# Patient Record
Sex: Male | Born: 1990 | Race: Black or African American | Hispanic: No | Marital: Single | Smoking: Never smoker
Health system: Southern US, Community
[De-identification: ages and names within clinical notes are randomized; demographics above are authoritative.]

---

## 2015-05-04 ENCOUNTER — Emergency Department (HOSPITAL_COMMUNITY): Payer: Self-pay

## 2015-05-04 ENCOUNTER — Emergency Department (HOSPITAL_COMMUNITY)
Admission: EM | Admit: 2015-05-04 | Discharge: 2015-05-04 | Disposition: A | Discharge status: Home / Self Care | Payer: Self-pay | Attending: Emergency Medicine | Admitting: Emergency Medicine

## 2015-05-04 ENCOUNTER — Encounter (HOSPITAL_COMMUNITY): Payer: Self-pay | Admitting: Emergency Medicine

## 2015-05-04 DIAGNOSIS — R1033 Periumbilical pain: Secondary | ICD-10-CM | POA: Insufficient documentation

## 2015-05-04 DIAGNOSIS — R61 Generalized hyperhidrosis: Secondary | ICD-10-CM | POA: Insufficient documentation

## 2015-05-04 DIAGNOSIS — B349 Viral infection, unspecified: Secondary | ICD-10-CM | POA: Insufficient documentation

## 2015-05-04 DIAGNOSIS — R509 Fever, unspecified: Secondary | ICD-10-CM | POA: Insufficient documentation

## 2015-05-04 DIAGNOSIS — F419 Anxiety disorder, unspecified: Secondary | ICD-10-CM | POA: Insufficient documentation

## 2015-05-04 DIAGNOSIS — R109 Unspecified abdominal pain: Secondary | ICD-10-CM

## 2015-05-04 DIAGNOSIS — E869 Volume depletion, unspecified: Secondary | ICD-10-CM | POA: Insufficient documentation

## 2015-05-04 LAB — COMPREHENSIVE METABOLIC PANEL
ALBUMIN: 3.8 g/dL (ref 3.5–5.0)
ALK PHOS: 58 U/L (ref 38–126)
ALT: 30 U/L (ref 17–63)
AST: 47 U/L — AB (ref 15–41)
Anion gap: 10 (ref 5–15)
BUN: 13 mg/dL (ref 6–20)
CHLORIDE: 103 mmol/L (ref 101–111)
CO2: 23 mmol/L (ref 22–32)
Calcium: 9 mg/dL (ref 8.9–10.3)
Creatinine, Ser: 1.34 mg/dL — ABNORMAL HIGH (ref 0.61–1.24)
GFR calc Af Amer: >60 mL/min (ref >60)
Glucose, Bld: 94 mg/dL (ref 65–99)
POTASSIUM: 3.7 mmol/L (ref 3.5–5.1)
SODIUM: 136 mmol/L (ref 135–145)
Total Bilirubin: 0.6 mg/dL (ref 0.3–1.2)
Total Protein: 7.6 g/dL (ref 6.5–8.1)

## 2015-05-04 LAB — CBC WITH DIFFERENTIAL/PLATELET
Basophils Absolute: 0.2 10*3/uL — ABNORMAL HIGH (ref 0.0–0.1)
Basophils Relative: 3 % — ABNORMAL HIGH (ref 0–1)
EOS ABS: 0 10*3/uL (ref 0.0–0.7)
Eosinophils Relative: 0 % (ref 0–5)
HCT: 39.8 % (ref 39.0–52.0)
HEMOGLOBIN: 12.9 g/dL — AB (ref 13.0–17.0)
Lymphocytes Relative: 36 % (ref 12–46)
Lymphs Abs: 1.9 10*3/uL (ref 0.7–4.0)
MCH: 28.5 pg (ref 26.0–34.0)
MCHC: 32.4 g/dL (ref 30.0–36.0)
MCV: 87.9 fL (ref 78.0–100.0)
MONO ABS: 0.6 10*3/uL (ref 0.1–1.0)
Monocytes Relative: 11 % (ref 3–12)
NEUTROS PCT: 50 % (ref 43–77)
Neutro Abs: 2.5 10*3/uL (ref 1.7–7.7)
Platelets: 148 10*3/uL — ABNORMAL LOW (ref 150–400)
RBC: 4.53 MIL/uL (ref 4.22–5.81)
RDW: 12.2 % (ref 11.5–15.5)
WBC: 5.2 10*3/uL (ref 4.0–10.5)

## 2015-05-04 LAB — URINALYSIS, ROUTINE W REFLEX MICROSCOPIC
Bilirubin Urine: NEGATIVE
Glucose, UA: NEGATIVE mg/dL
HGB URINE DIPSTICK: NEGATIVE
Ketones, ur: 15 mg/dL — AB
NITRITE: NEGATIVE
PH: 6 (ref 5.0–8.0)
Protein, ur: NEGATIVE mg/dL
Specific Gravity, Urine: 1.024 (ref 1.005–1.030)
Urobilinogen, UA: 1 mg/dL (ref 0.0–1.0)

## 2015-05-04 LAB — LIPASE, BLOOD: Lipase: 21 U/L — ABNORMAL LOW (ref 22–51)

## 2015-05-04 LAB — URINE MICROSCOPIC-ADD ON

## 2015-05-04 MED ORDER — IOHEXOL 300 MG/ML  SOLN
100.0000 mL | Freq: Once | INTRAMUSCULAR | Status: AC | PRN
  Administered 2015-05-04: 100 mL via INTRAVENOUS

## 2015-05-04 MED ORDER — MORPHINE SULFATE 4 MG/ML IJ SOLN
4.0000 mg | Freq: Once | INTRAMUSCULAR | Status: AC
  Administered 2015-05-04: 4 mg via INTRAVENOUS
  Filled 2015-05-04: qty 1

## 2015-05-04 MED ORDER — IOHEXOL 300 MG/ML  SOLN
25.0000 mL | Freq: Once | INTRAMUSCULAR | Status: AC | PRN
  Administered 2015-05-04: 25 mL via ORAL

## 2015-05-04 MED ORDER — IBUPROFEN 400 MG PO TABS
600.0000 mg | ORAL_TABLET | Freq: Once | ORAL | Status: AC
  Administered 2015-05-04: 200 mg via ORAL
  Filled 2015-05-04 (×2): qty 1

## 2015-05-04 MED ORDER — SODIUM CHLORIDE 0.9 % IV BOLUS (SEPSIS)
2000.0000 mL | Freq: Once | INTRAVENOUS | Status: AC
  Administered 2015-05-04: 2000 mL via INTRAVENOUS

## 2015-05-04 MED ORDER — IBUPROFEN 600 MG PO TABS: 600.0000 mg | ORAL_TABLET | Freq: Three times a day (TID) | ORAL | Status: DC | PRN

## 2015-05-04 NOTE — ED Provider Notes (Signed)
CSN: [id number]     Arrival date & time 05/04/15  0040 History   This chart was scribed for Azalia Bilis, MD by Abel Presto, ED Scribe. This patient was seen in room D34C/D34C and the patient's care was started at 2:09 AM.    Chief Complaint  Patient presents with  . Headache    The history is provided by the patient. No language interpreter was used.   HPI Comments: Randall Frank is a 24 y.o. male with no significant PMHx who presents to the Emergency Department complaining of headache with onset 3 days ago. He notes associated periumbilical abdominal pain, chills and diaphoresis. He states he feels anxious. He has been eating and drinking well and states he has been staying hydrated. He has taken 1 200 mg ibuprofen for relief. Pt denies recent sick contacts and tick bites. Pt denies nausea, vomiting, diarrhea, difficulty urinating, dysuria, frequency, sore throat, chest pain and SOB.   History reviewed. No pertinent past medical history. History reviewed. No pertinent past surgical history. No family history on file. History  Substance Use Topics  . Smoking status: Never Smoker   . Smokeless tobacco: Not on file  . Alcohol Use: Yes    Review of Systems  Neurological: Positive for headaches.  A complete 10 system review of systems was obtained and all systems are negative except as noted in the HPI and PMH.      Allergies  Review of patient's allergies indicates no known allergies.  Home Medications   Prior to Admission medications   Not on File   BP 116/56 mmHg  Pulse 62  Temp(Src) 98.2 F (36.8 C) (Oral)  Resp 18  Wt 190 lb 4.8 oz (86.32 kg)  SpO2 99% Physical Exam  Constitutional: He is oriented to person, place, and time. He appears well-developed and well-nourished.  Tactile fever  HENT:  Head: Normocephalic and atraumatic.  Eyes: EOM are normal.  Neck: Normal range of motion.  Cardiovascular: Normal rate, regular rhythm, normal heart sounds and intact  distal pulses.   Pulmonary/Chest: Effort normal and breath sounds normal. No respiratory distress.  Abdominal: Soft. He exhibits no distension. There is tenderness (mild) in the periumbilical area.  Musculoskeletal: Normal range of motion.  Neurological: He is alert and oriented to person, place, and time.  Skin: Skin is warm and dry.  Psychiatric: He has a normal mood and affect. Judgment normal.  Nursing note and vitals reviewed.   ED Course  Procedures (including critical care time) DIAGNOSTIC STUDIES: Oxygen Saturation is 99% on room air, normal by my interpretation.    COORDINATION OF CARE: 2:15 AM Discussed treatment plan with patient at beside, the patient agrees with the plan and has no further questions at this time.   Labs Review Labs Reviewed  CBC WITH DIFFERENTIAL/PLATELET - Abnormal; Notable for the following:    Hemoglobin 12.9 (*)    Platelets 148 (*)    Basophils Relative 3 (*)    Basophils Absolute 0.2 (*)    All other components within normal limits  COMPREHENSIVE METABOLIC PANEL - Abnormal; Notable for the following:    Creatinine, Ser 1.34 (*)    AST 47 (*)    All other components within normal limits  LIPASE, BLOOD - Abnormal; Notable for the following:    Lipase 21 (*)    All other components within normal limits  URINALYSIS, ROUTINE W REFLEX MICROSCOPIC (NOT AT [facility]) - Abnormal; Notable for the following:    APPearance CLOUDY (*)  Ketones, ur 15 (*)    Leukocytes, UA SMALL (*)    All other components within normal limits  URINE MICROSCOPIC-ADD ON    Imaging Review Ct Abdomen Pelvis W Contrast  05/04/2015   CLINICAL DATA:  Periumbilical pain for 3 days.  Fever.  EXAM: CT ABDOMEN AND PELVIS WITH CONTRAST  TECHNIQUE: Multidetector CT imaging of the abdomen and pelvis was performed using the standard protocol following bolus administration of intravenous contrast.  CONTRAST:  OMNIPAQUE IOHEXOL 300 MG/ML  SOLN  COMPARISON:  None.  FINDINGS: Lower  chest:  The included lung bases are clear.  Liver: Normal in size.  No focal lesion.  Hepatobiliary: Gallbladder is decompressed.  No biliary dilatation.  Pancreas: Normal.  Spleen: Normal.  Adrenal glands: No nodule.  Kidneys: Symmetric renal enhancement. No hydronephrosis. No focal abnormality or perinephric stranding.  Stomach/Bowel: Stomach physiologically distended. There are no dilated or thickened small bowel loops. Small volume of stool throughout the colon without colonic wall thickening. The appendix is normal.  Vascular/Lymphatic: No retroperitoneal adenopathy. Abdominal aorta is normal in caliber. Is history small left renal artery, incidentally noted.  Reproductive: Prostate gland is normal in size.  Bladder: Minimally distended, no wall thickening.  Other: No free air, free fluid, or intra-abdominal fluid collection.  Musculoskeletal: There are no acute or suspicious osseous abnormalities. Incidental note of 4 non-rib-bearing lumbar vertebra, a normal variant.  IMPRESSION: No acute abnormality in the abdomen/pelvis.   Electronically Signed   By: Rubye Oaks M.D.   On: 05/04/2015 05:51  I personally reviewed the imaging tests through PACS system I reviewed available ER/hospitalization records through the EMR    EKG Interpretation None      MDM   Final diagnoses:  Fever, unspecified fever cause  Abdominal pain, unspecified abdominal location   Volume depletion and viral illness. Abdominal tenderness, however CT scan negative  I personally performed the services described in this documentation, which was scribed in my presence. The recorded information has been reviewed and is accurate.        Azalia Bilis, MD 05/05/15 [phone]

## 2015-05-04 NOTE — ED Notes (Signed)
To ct

## 2015-05-04 NOTE — ED Notes (Signed)
Pt. reports intermittent headache with chills onset 3 days ago unrelieved by OTC Ibuprofen , denies head injury / no fever .

## 2015-05-04 NOTE — ED Notes (Signed)
The pt had  of ibu 3 hours ago

## 2015-05-04 NOTE — Discharge Instructions (Signed)

## 2015-05-04 NOTE — ED Notes (Signed)
The pts headache is better ?

## 2015-05-04 NOTE — ED Notes (Signed)
Patient transported to CT 

## 2015-05-04 NOTE — ED Notes (Signed)
The pt feels strange in his head for 3 days  He has had chills and sweating also.  No n v or diarrhea. The pt does not usually have headaches.  Family at the bedside

## 2017-02-10 ENCOUNTER — Ambulatory Visit (INDEPENDENT_AMBULATORY_CARE_PROVIDER_SITE_OTHER): Payer: BLUE CROSS/BLUE SHIELD | Admitting: Family Medicine

## 2017-02-10 ENCOUNTER — Encounter: Payer: Self-pay | Admitting: Family Medicine

## 2017-02-10 VITALS — BP 127/72 | HR 55 | Temp 98.3°F | Resp 18 | Ht 68.39 in | Wt 179.6 lb

## 2017-02-10 DIAGNOSIS — L02419 Cutaneous abscess of limb, unspecified: Secondary | ICD-10-CM

## 2017-02-10 MED ORDER — DOXYCYCLINE HYCLATE 100 MG PO TABS: 100.0000 mg | ORAL_TABLET | Freq: Two times a day (BID) | ORAL | 0 refills | Status: DC

## 2017-02-10 NOTE — Progress Notes (Signed)
PROCEDURE NOTE: I&D of Abscess Verbal consent obtained. Alcohol prep pad used. Local anesthesia with 1.5cc of 1% lidocaine with epinephrine. Incision of 1cm was made using a 11 blade, discharge of copious amounts of pus and serosanguinous fluid. Wound cavity was explored with curved hemostats and packed with 1/4" plain packing. Cleansed and dressed.  Wallis BambergMario Bernadene Garside, PA-C [facility] at [facility] [phone] 02/10/2017  5:57 PM

## 2017-02-10 NOTE — Progress Notes (Signed)
   Randall Frank is a 26 y.o. male who presents to [facility] at [facility] today for abscess in axilla.  1.  Reports abscess in left axilla for 3 days. Area is tender. Denies drainage. Denies fever. No other abscess noted in groin or right axilla. Reports history of prior, with most recent 4 months ago. Had bactrim at home from previous abscess, which he started 3 days ago.   No real improvement with this.    ROS as above. No fevers/chills/nausea/vomiting.  No other skin lesions or rashes.   PMH reviewed. Patient is a nonsmoker.   History reviewed. No pertinent past medical history. History reviewed. No pertinent surgical history.  Medications reviewed. Current Outpatient Prescriptions  Medication Sig Dispense Refill  . ibuprofen (ADVIL,MOTRIN) 600 MG tablet Take 1 tablet (600 mg total) by mouth every 8 (eight) hours as needed. 15 tablet 0   No current facility-administered medications for this visit.    Physical Exam:  BP 127/72 (BP Location: Right Arm, Patient Position: Sitting, Cuff Size: Small)   Pulse (!) 55   Temp 98.3 F (36.8 C) (Oral)   Resp 18   Ht 5' 8.39" (1.737 m)   Wt 179 lb 9.6 oz (81.5 kg)   SpO2 96%   BMI 27.00 kg/m  Gen:  Alert, cooperative patient who appears stated age in no acute distress.  Vital signs reviewed. HEENT: EOMI,  MMM Pulm:  Clear to auscultation bilaterally  Cardiac:  Regular rate and rhythm  Abd:  Soft/nondistended/nontender.  Skin: Area of fluctuance with slight erythema noted in left axilla  Assessment and Plan:  1.  Abscess, axillary:  - I&D here in clinic - treating with abx to fully cover - no culture.  Doxycycline will cover for MRSA - return for wound recheck/packing check in 3 days

## 2017-02-10 NOTE — Patient Instructions (Addendum)
It was good to see you today.   Take the doxycycline twice daily for the next 7 days.    If you start having any redness, fevers, or worsening symptoms come back and see us immediately.    Otherwise come back in 3 days so we can check the packing.   Incision and Drainage, Care After Refer to this sheet in the next few weeks. These instructions provide you with information about caring for yourself after your procedure. Your health care provider may also give you more specific instructions. Your treatment has been planned according to current medical practices, but problems sometimes occur. Call your health care provider if you have any problems or questions after your procedure. What can I expect after the procedure? After the procedure, it is common to have:  Pain or discomfort around your incision site.  Drainage from your incision. Follow these instructions at home:  Take over-the-counter and prescription medicines only as told by your health care provider.  If you were prescribed an antibiotic medicine, take it as told by your health care provider.Do not stop taking the antibiotic even if you start to feel better.  Followinstructions from your health care provider about:  How to take care of your incision.  When and how you should change your packing and bandage (dressing). Wash your hands with soap and water before you change your dressing. If soap and water are not available, use hand sanitizer.  When you should remove your dressing.  Do not take baths, swim, or use a hot tub until your health care provider approves.  Keep all follow-up visits as told by your health care provider. This is important.  Check your incision area every day for signs of infection. Check for:  More redness, swelling, or pain.  More fluid or blood.  Warmth.  Pus or a bad smell. Contact a health care provider if:  Your cyst or abscess returns.  You have a fever.  You have more redness,  swelling, or pain around your incision.  You have more fluid or blood coming from your incision.  Your incision feels warm to the touch.  You have pus or a bad smell coming from your incision. Get help right away if:  You have severe pain or bleeding.  You cannot eat or drink without vomiting.  You have decreased urine output.  You become short of breath.  You have chest pain.  You cough up blood.  The area where the incision and drainage occurred becomes numb or it tingles. This information is not intended to replace advice given to you by your health care provider. Make sure you discuss any questions you have with your health care provider. Document Released: 12/14/2011 Document Revised: 02/21/2016 Document Reviewed: 07/12/2015 Elsevier Interactive Patient Education  2017 [employer]     IF you received an x-ray today, you will receive an invoice from [facility]. Please contact [facility] at [phone] with questions or concerns regarding your invoice.   IF you received labwork today, you will receive an invoice from [facility]. Please contact [facility] at [phone] with questions or concerns regarding your invoice.   Our billing staff will not be able to assist you with questions regarding bills from these companies.  You will be contacted with the lab results as soon as they are available. The fastest way to get your results is to activate your My Chart account. Instructions are located on the last page of this paperwork. If you have not  heard from [place] regarding the results in 2 weeks, please contact this office.

## 2017-02-13 ENCOUNTER — Encounter: Payer: Self-pay | Admitting: Physician Assistant

## 2017-02-13 ENCOUNTER — Ambulatory Visit (INDEPENDENT_AMBULATORY_CARE_PROVIDER_SITE_OTHER): Payer: BLUE CROSS/BLUE SHIELD | Admitting: Physician Assistant

## 2017-02-13 VITALS — BP 135/74 | HR 48 | Temp 98.1°F | Resp 18 | Ht 68.39 in | Wt 178.0 lb

## 2017-02-13 DIAGNOSIS — Z5189 Encounter for other specified aftercare: Secondary | ICD-10-CM

## 2017-02-13 DIAGNOSIS — L02419 Cutaneous abscess of limb, unspecified: Secondary | ICD-10-CM

## 2017-02-13 NOTE — Patient Instructions (Signed)
     IF you received an x-ray today, you will receive an invoice from [facility]. Please contact [facility] at [phone] with questions or concerns regarding your invoice.   IF you received labwork today, you will receive an invoice from [facility]. Please contact [facility] at [phone] with questions or concerns regarding your invoice.   Our billing staff will not be able to assist you with questions regarding bills from these companies.  You will be contacted with the lab results as soon as they are available. The fastest way to get your results is to activate your My Chart account. Instructions are located on the last page of this paperwork. If you have not heard from us regarding the results in 2 weeks, please contact this office.     

## 2017-02-13 NOTE — Progress Notes (Signed)
Primary Care at [facility]   MRN: [id number] DOB: 11/09/1990  Subjective:   Randall RoadsBlake Frank is a 26 y.o. male presenting for follow up on wound.  He was here 5/9 for abscess on axilla. He received an I&D and wound was packed with 1/4" packing.  No culture was taken.  Today he reports improvement of pain. He is taking Doxycycline.    Harrison MonsBlake has a current medication list which includes the following prescription(s): doxycycline and ibuprofen. Also has No Known Allergies.  Harrison MonsBlake  has no past medical history on file. Also  has no past surgical history on file.   Objective:   Vitals: BP 135/74   Pulse (!) 48   Temp 98.1 F (36.7 C) (Oral)   Resp 18   Ht 5' 8.39" (1.737 m)   Wt 178 lb (80.7 kg)   SpO2 99%   BMI 26.76 kg/m   Physical Exam  Skin:  Packing in place. Removed without difficulty. Mild induration. No erythema. Mildly TTP.  No serous sanguinous or purulent drainage with deep palpation. Irrigation with 5 cc lidocaine. Wound explored and found to be about 2 cm. Wound lightly repacked with 1/4in packing and wound dressed.     No results found for this or any previous visit (from the past 24 hour(s)).  Assessment and Plan :  1. Encounter for wound care 2. Abscess, axilla - Wound irrigated and lightly repacked with 1/4 inch packing. No culture taken on original visit. He is taking Doxycycline. Wound care discussed. RTC in 48 hours for wound care.   Marco CollieWhitney Cybill Uriegas, PA-C  [facility] at [facility] 02/13/2017 10:32 AM

## 2017-02-17 ENCOUNTER — Ambulatory Visit: Payer: BLUE CROSS/BLUE SHIELD | Admitting: Physician Assistant

## 2019-12-07 ENCOUNTER — Emergency Department (HOSPITAL_COMMUNITY)
Admission: EM | Admit: 2019-12-07 | Discharge: 2019-12-07 | Disposition: A | Discharge status: Home / Self Care | Payer: Self-pay | Attending: Emergency Medicine | Admitting: Emergency Medicine

## 2019-12-07 ENCOUNTER — Encounter (HOSPITAL_COMMUNITY): Payer: Self-pay | Admitting: Emergency Medicine

## 2019-12-07 ENCOUNTER — Emergency Department (HOSPITAL_COMMUNITY): Payer: Self-pay

## 2019-12-07 ENCOUNTER — Other Ambulatory Visit: Payer: Self-pay

## 2019-12-07 DIAGNOSIS — Y929 Unspecified place or not applicable: Secondary | ICD-10-CM | POA: Insufficient documentation

## 2019-12-07 DIAGNOSIS — Z23 Encounter for immunization: Secondary | ICD-10-CM | POA: Insufficient documentation

## 2019-12-07 DIAGNOSIS — Y939 Activity, unspecified: Secondary | ICD-10-CM | POA: Insufficient documentation

## 2019-12-07 DIAGNOSIS — S21212A Laceration without foreign body of left back wall of thorax without penetration into thoracic cavity, initial encounter: Secondary | ICD-10-CM

## 2019-12-07 DIAGNOSIS — S31010A Laceration without foreign body of lower back and pelvis without penetration into retroperitoneum, initial encounter: Secondary | ICD-10-CM | POA: Insufficient documentation

## 2019-12-07 DIAGNOSIS — Y999 Unspecified external cause status: Secondary | ICD-10-CM | POA: Insufficient documentation

## 2019-12-07 DIAGNOSIS — S022XXB Fracture of nasal bones, initial encounter for open fracture: Secondary | ICD-10-CM

## 2019-12-07 DIAGNOSIS — S0101XA Laceration without foreign body of scalp, initial encounter: Secondary | ICD-10-CM | POA: Insufficient documentation

## 2019-12-07 DIAGNOSIS — S022XXA Fracture of nasal bones, initial encounter for closed fracture: Secondary | ICD-10-CM | POA: Insufficient documentation

## 2019-12-07 MED ORDER — LIDOCAINE-EPINEPHRINE 1 %-1:100000 IJ SOLN
30.0000 mL | Freq: Once | INTRAMUSCULAR | Status: AC
  Administered 2019-12-07: 04:00:00 30 mL
  Filled 2019-12-07: qty 30

## 2019-12-07 MED ORDER — HYDROCODONE-ACETAMINOPHEN 5-325 MG PO TABS
1.0000 | ORAL_TABLET | Freq: Once | ORAL | Status: AC
Start: 2019-12-07 — End: 2019-12-07
  Administered 2019-12-07: 02:00:00 1 via ORAL
  Filled 2019-12-07: qty 1

## 2019-12-07 MED ORDER — CEPHALEXIN 250 MG PO CAPS: 250.0000 mg | ORAL_CAPSULE | Freq: Four times a day (QID) | ORAL | 0 refills | Status: DC

## 2019-12-07 MED ORDER — LIDOCAINE-EPINEPHRINE (PF) 2 %-1:200000 IJ SOLN
20.0000 mL | Freq: Once | INTRAMUSCULAR | Status: DC
  Filled 2019-12-07: qty 20

## 2019-12-07 MED ORDER — LIDOCAINE-EPINEPHRINE (PF) 2 %-1:200000 IJ SOLN: 30.0000 mL | Freq: Once | INTRAMUSCULAR | Status: DC

## 2019-12-07 MED ORDER — TETANUS-DIPHTH-ACELL PERTUSSIS 5-2.5-18.5 LF-MCG/0.5 IM SUSP
0.5000 mL | Freq: Once | INTRAMUSCULAR | Status: AC
  Administered 2019-12-07: 0.5 mL via INTRAMUSCULAR
  Filled 2019-12-07: qty 0.5

## 2019-12-07 MED ORDER — METHOCARBAMOL 500 MG PO TABS: 500.0000 mg | ORAL_TABLET | Freq: Two times a day (BID) | ORAL | 0 refills | Status: DC | PRN

## 2019-12-07 MED ORDER — CEFAZOLIN SODIUM-DEXTROSE 1-4 GM/50ML-% IV SOLN
1.0000 g | Freq: Once | INTRAVENOUS | Status: AC
  Administered 2019-12-07: 1 g via INTRAVENOUS
  Filled 2019-12-07: qty 50

## 2019-12-07 NOTE — Discharge Instructions (Signed)
Take ibuprofen 3 times a day with meals.  Do not take other anti-inflammatories at the same time (Advil, Motrin, ibuprofen, Aleve). You may supplement with Tylenol if you need further pain control. Use Robaxin as needed for muscle stiffness or soreness. Have caution, as this may make you tired or groggy. Do not drive or operate heavy machinery while taking this medication.  Use muscle creams (bengay, icy hot, salonpas) as needed for pain.   Wash your cuts every day with soap and water.  After, keep dry and clean.  Do not apply any ointments. Staples should be removed 10 days.  This can be done at urgent care or back in the emergency department. Take antibiotics as prescribed.  Take entire course, even if your symptoms improve. Do not bend at your back, especially to lift heavy things.  Call the ear nose and throat doctor listed below in the morning to set up a follow-up appointment for your nose fracture.  Return to the emergency room if you develop high fevers, persistent vomiting, pus draining from any of the wounds, vision changes, or any new, worsening, concerning symptoms.
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
## 2019-12-07 NOTE — ED Notes (Signed)
Pt ambulated to BR independent successfully

## 2019-12-07 NOTE — ED Notes (Signed)
PA at bedside.
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
## 2019-12-07 NOTE — ED Notes (Signed)
Pt transported to xray, will then go to CT afterward
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
## 2023-06-16 ENCOUNTER — Emergency Department (HOSPITAL_BASED_OUTPATIENT_CLINIC_OR_DEPARTMENT_OTHER)
Admission: EM | Admit: 2023-06-16 | Discharge: 2023-06-16 | Disposition: A | Discharge status: Home / Self Care | Payer: Self-pay | Attending: Emergency Medicine | Admitting: Emergency Medicine

## 2023-06-16 ENCOUNTER — Other Ambulatory Visit: Payer: Self-pay

## 2023-06-16 ENCOUNTER — Encounter (HOSPITAL_BASED_OUTPATIENT_CLINIC_OR_DEPARTMENT_OTHER): Payer: Self-pay

## 2023-06-16 DIAGNOSIS — K611 Rectal abscess: Secondary | ICD-10-CM | POA: Insufficient documentation

## 2023-06-16 MED ORDER — DOXYCYCLINE HYCLATE 100 MG PO CAPS
100.0000 mg | ORAL_CAPSULE | Freq: Two times a day (BID) | ORAL | 0 refills | Status: AC
Start: 2023-06-16 — End: ?

## 2023-06-16 MED ORDER — LIDOCAINE-EPINEPHRINE (PF) 2 %-1:200000 IJ SOLN
10.0000 mL | Freq: Once | INTRAMUSCULAR | Status: AC
  Administered 2023-06-16: 10 mL
  Filled 2023-06-16: qty 20

## 2023-06-16 MED ORDER — OXYCODONE-ACETAMINOPHEN 5-325 MG PO TABS
1.0000 | ORAL_TABLET | Freq: Once | ORAL | Status: AC
  Administered 2023-06-16: 1 via ORAL
  Filled 2023-06-16: qty 1

## 2023-06-16 MED ORDER — OXYCODONE-ACETAMINOPHEN 5-325 MG PO TABS
1.0000 | ORAL_TABLET | Freq: Four times a day (QID) | ORAL | 0 refills | Status: AC | PRN
Start: 2023-06-16 — End: ?

## 2023-06-16 NOTE — Discharge Instructions (Signed)
Please read and follow all provided instructions.  Your diagnoses today include:  1. Perirectal abscess     Tests performed today include: Vital signs. See below for your results today.   Medications prescribed:  Percocet (oxycodone/acetaminophen) - narcotic pain medication  DO NOT drive or perform any activities that require you to be awake and alert because this medicine can make you drowsy. BE VERY CAREFUL not to take multiple medicines containing Tylenol (also called acetaminophen). Doing so can lead to an overdose which can damage your liver and cause liver failure and possibly death.  Doxycycline - antibiotic  You have been prescribed an antibiotic medicine: take the entire course of medicine even if you are feeling better. Stopping early can cause the antibiotic not to work.  Take any prescribed medications only as directed.   Home care instructions:  Follow any educational materials contained in this packet  Follow-up instructions: Return to the Emergency Department in 48 hours for a recheck if your symptoms are not significantly improved.  Please follow-up with your primary care provider in the next 1 week for further evaluation of your symptoms.   Return instructions:  Return to the Emergency Department if you have: Fever Worsening symptoms Worsening pain Worsening swelling Redness of the skin that moves away from the affected area, especially if it streaks away from the affected area  Any other emergent concerns   Your vital signs today were: BP (!) 146/82   Pulse 62   Temp 98.2 F (36.8 C) (Oral)   Resp 17   Ht 5\' 10"  (1.778 m)   Wt 90.7 kg   SpO2 99%   BMI 28.70 kg/m  If your blood pressure (BP) was elevated above 135/85 this visit, please have this repeated by your doctor within one month. --------------

## 2023-06-16 NOTE — ED Notes (Signed)
Abscess noted on Pt. L butt cheek.

## 2023-06-16 NOTE — ED Triage Notes (Signed)
Pt presents to ED from home C/O pilonidal cyst since X 5 days, hx of same.

## 2023-06-16 NOTE — ED Provider Notes (Signed)
[place] [facility] Provider Note   CSN: [id number] Arrival date & time: 06/16/23  1212     History  Chief Complaint  Patient presents with   Abscess    Randall Frank is a 32 y.o. male.  Patient with history of perirectal abscess presents the emergency department for recurrent perirectal abscess.  First noted worsening symptoms over the past 1 week, gradually worsening.  He has had incision and drainage in the past.  No fevers.  No blood in the stool.       Home Medications Prior to Admission medications   Medication Sig Start Date End Date Taking? Authorizing Provider  cephALEXin (KEFLEX) 250 MG capsule Take 1 capsule (250 mg total) by mouth 4 (four) times daily. 12/07/19   Caccavale, Sophia, PA-C  doxycycline (VIBRA-TABS) 100 MG tablet Take 1 tablet (100 mg total) by mouth 2 (two) times daily. X 7 days Patient not taking: Reported on 12/07/2019 02/10/17   Tobey Grim, MD  ibuprofen (ADVIL,MOTRIN) 600 MG tablet Take 1 tablet (600 mg total) by mouth every 8 (eight) hours as needed. Patient not taking: Reported on 02/13/2017 05/04/15   Azalia Bilis, MD  methocarbamol (ROBAXIN) 500 MG tablet Take 1 tablet (500 mg total) by mouth 2 (two) times daily as needed for muscle spasms. 12/07/19   Caccavale, Sophia, PA-C      Allergies    Patient has no known allergies.    Review of Systems   Review of Systems  Physical Exam Updated Vital Signs BP 139/79 (BP Location: Left Arm)   Pulse 73   Temp 98.2 F (36.8 C) (Oral)   Resp 18   Ht 5\' 10"  (1.778 m)   Wt 90.7 kg   SpO2 99%   BMI 28.70 kg/m  Physical Exam Vitals and nursing note reviewed.  Constitutional:      Appearance: He is well-developed.  HENT:     Head: Normocephalic and atraumatic.  Eyes:     Conjunctiva/sclera: Conjunctivae normal.  Pulmonary:     Effort: No respiratory distress.  Genitourinary:    Comments: Perirectal abscess noted with firmness and fluctuance along  the gluteal cleft of the left buttocks.  No active drainage.  Area is exquisitely tender. Musculoskeletal:     Cervical back: Normal range of motion and neck supple.  Skin:    General: Skin is warm and dry.  Neurological:     Mental Status: He is alert.     ED Results / Procedures / Treatments   Labs (all labs ordered are listed, but only abnormal results are displayed) Labs Reviewed - No data to display  EKG None  Radiology No results found.  Procedures .Marland KitchenIncision and Drainage  Date/Time: 06/16/2023 4:24 PM  Performed by: Renne Crigler, PA-C Authorized by: Renne Crigler, PA-C   Consent:    Consent obtained:  Verbal   Consent given by:  Patient Universal protocol:    Patient identity confirmed:  Verbally with patient and provided demographic data Location:    Type:  Abscess   Size:  4cm   Location:  Anogenital   Anogenital location:  Perirectal Pre-procedure details:    Skin preparation:  Povidone-iodine Sedation:    Sedation type:  None Anesthesia:    Anesthesia method:  Local infiltration   Local anesthetic:  Lidocaine 2% WITH epi Procedure type:    Complexity:  Complex Procedure details:    Ultrasound guidance: yes     Incision types:  Stab incision  Wound management:  Probed and deloculated   Drainage:  Purulent   Drainage amount:  Copious   Packing materials:  None Post-procedure details:    Procedure completion:  Tolerated well, no immediate complications     Medications Ordered in ED Medications  oxyCODONE-acetaminophen (PERCOCET/ROXICET) 5-325 MG per tablet 1 tablet (has no administration in time range)  lidocaine-EPINEPHrine (XYLOCAINE W/EPI) 2 %-1:200000 (PF) injection 10 mL (has no administration in time range)    ED Course/ Medical Decision Making/ A&P    Patient seen and examined. History obtained directly from patient.   Labs/EKG: None ordered  Imaging: None ordered.  Bedside ultrasound confirms fluid collection at the area of  fluctuance.  Medications/Fluids: Ordered: P.o. Percocet, lidocaine 2% with epinephrine  Most recent vital signs reviewed and are as follows: BP 139/79 (BP Location: Left Arm)   Pulse 73   Temp 98.2 F (36.8 C) (Oral)   Resp 18   Ht 5\' 10"  (1.778 m)   Wt 90.7 kg   SpO2 99%   BMI 28.70 kg/m   Initial impression: Perirectal abscess  4:24 PM Reassessment performed. Patient appears stable.  Tolerated I&D with pain.  Reviewed pertinent lab work and imaging with patient at bedside. Questions answered.   Most current vital signs reviewed and are as follows: BP (!) 146/82   Pulse 62   Temp 98.2 F (36.8 C) (Oral)   Resp 17   Ht 5\' 10"  (1.778 m)   Wt 90.7 kg   SpO2 99%   BMI 28.70 kg/m   Plan: Discharge to home.   Prescriptions written for: Percocet # 5 tablets, doxycycline  Other home care instructions discussed: Warm soaks twice a day and then as needed after several days  ED return instructions discussed: The patient was urged to return to the Emergency Department urgently with worsening pain, swelling, expanding erythema especially if it streaks away from the affected area, fever, or if they have any other concerns.   The patient was urged to return to the Emergency Department or go to their PCP in 48 hours for wound recheck if the area is not significantly improved.  The patient verbalized understanding and stated agreement with this plan.   Follow-up instructions discussed: Patient encouraged to follow-up with their PCP as needed, gen surgery in future if this is a recurrent problem.                                  Medical Decision Making Risk Prescription drug management.   Patient with perirectal abscess.  Drained well without acute complication.  Home with pain control and antibiotics.  No indication for emergent surgical consultation.          Final Clinical Impression(s) / ED Diagnoses Final diagnoses:  Perirectal abscess    Rx / DC Orders ED  Discharge Orders          Ordered    oxyCODONE-acetaminophen (PERCOCET/ROXICET) 5-325 MG tablet  Every 6 hours PRN        06/16/23 1623    doxycycline (VIBRAMYCIN) 100 MG capsule  2 times daily        06/16/23 1623              Renne Crigler, PA-C 06/16/23 1627    Horton, Clabe Seal, DO 06/17/23 [phone]
